# Patient Record
Sex: Male | Born: 1960 | Race: White | Hispanic: No | Marital: Married | Smoking: Never smoker
Health system: Southern US, Community
[De-identification: ages and names within clinical notes are randomized; demographics above are authoritative.]

---

## 2014-07-16 HISTORY — PX: CATARACT EXTRACTION: SUR2

## 2016-07-17 DIAGNOSIS — S83412A Sprain of medial collateral ligament of left knee, initial encounter: Secondary | ICD-10-CM | POA: Diagnosis not present

## 2016-07-17 DIAGNOSIS — M25562 Pain in left knee: Secondary | ICD-10-CM | POA: Diagnosis not present

## 2016-08-14 DIAGNOSIS — Z0000 Encounter for general adult medical examination without abnormal findings: Secondary | ICD-10-CM | POA: Diagnosis not present

## 2016-09-12 DIAGNOSIS — H43811 Vitreous degeneration, right eye: Secondary | ICD-10-CM | POA: Diagnosis not present

## 2016-09-12 DIAGNOSIS — H43391 Other vitreous opacities, right eye: Secondary | ICD-10-CM | POA: Diagnosis not present

## 2017-04-08 DIAGNOSIS — B3749 Other urogenital candidiasis: Secondary | ICD-10-CM | POA: Diagnosis not present

## 2017-04-08 DIAGNOSIS — Z23 Encounter for immunization: Secondary | ICD-10-CM | POA: Diagnosis not present

## 2017-09-17 DIAGNOSIS — Z13220 Encounter for screening for lipoid disorders: Secondary | ICD-10-CM | POA: Diagnosis not present

## 2017-09-17 DIAGNOSIS — Z0000 Encounter for general adult medical examination without abnormal findings: Secondary | ICD-10-CM | POA: Diagnosis not present

## 2017-09-25 DIAGNOSIS — X32XXXD Exposure to sunlight, subsequent encounter: Secondary | ICD-10-CM | POA: Diagnosis not present

## 2017-09-25 DIAGNOSIS — D225 Melanocytic nevi of trunk: Secondary | ICD-10-CM | POA: Diagnosis not present

## 2017-09-25 DIAGNOSIS — L821 Other seborrheic keratosis: Secondary | ICD-10-CM | POA: Diagnosis not present

## 2017-09-25 DIAGNOSIS — L570 Actinic keratosis: Secondary | ICD-10-CM | POA: Diagnosis not present

## 2017-10-26 DIAGNOSIS — M5416 Radiculopathy, lumbar region: Secondary | ICD-10-CM | POA: Diagnosis not present

## 2017-10-30 DIAGNOSIS — M5441 Lumbago with sciatica, right side: Secondary | ICD-10-CM | POA: Diagnosis not present

## 2017-10-30 DIAGNOSIS — M5136 Other intervertebral disc degeneration, lumbar region: Secondary | ICD-10-CM | POA: Diagnosis not present

## 2017-10-30 DIAGNOSIS — M9904 Segmental and somatic dysfunction of sacral region: Secondary | ICD-10-CM | POA: Diagnosis not present

## 2017-10-30 DIAGNOSIS — M9903 Segmental and somatic dysfunction of lumbar region: Secondary | ICD-10-CM | POA: Diagnosis not present

## 2017-11-11 DIAGNOSIS — M5136 Other intervertebral disc degeneration, lumbar region: Secondary | ICD-10-CM | POA: Diagnosis not present

## 2017-11-11 DIAGNOSIS — M9903 Segmental and somatic dysfunction of lumbar region: Secondary | ICD-10-CM | POA: Diagnosis not present

## 2017-11-11 DIAGNOSIS — M9904 Segmental and somatic dysfunction of sacral region: Secondary | ICD-10-CM | POA: Diagnosis not present

## 2017-11-13 DIAGNOSIS — M9903 Segmental and somatic dysfunction of lumbar region: Secondary | ICD-10-CM | POA: Diagnosis not present

## 2017-11-13 DIAGNOSIS — M5136 Other intervertebral disc degeneration, lumbar region: Secondary | ICD-10-CM | POA: Diagnosis not present

## 2017-11-13 DIAGNOSIS — M9904 Segmental and somatic dysfunction of sacral region: Secondary | ICD-10-CM | POA: Diagnosis not present

## 2017-11-18 DIAGNOSIS — M9903 Segmental and somatic dysfunction of lumbar region: Secondary | ICD-10-CM | POA: Diagnosis not present

## 2017-11-18 DIAGNOSIS — M9904 Segmental and somatic dysfunction of sacral region: Secondary | ICD-10-CM | POA: Diagnosis not present

## 2017-11-18 DIAGNOSIS — M5136 Other intervertebral disc degeneration, lumbar region: Secondary | ICD-10-CM | POA: Diagnosis not present

## 2017-11-22 DIAGNOSIS — M5136 Other intervertebral disc degeneration, lumbar region: Secondary | ICD-10-CM | POA: Diagnosis not present

## 2017-11-22 DIAGNOSIS — M9904 Segmental and somatic dysfunction of sacral region: Secondary | ICD-10-CM | POA: Diagnosis not present

## 2017-11-22 DIAGNOSIS — M9903 Segmental and somatic dysfunction of lumbar region: Secondary | ICD-10-CM | POA: Diagnosis not present

## 2017-11-25 DIAGNOSIS — M9903 Segmental and somatic dysfunction of lumbar region: Secondary | ICD-10-CM | POA: Diagnosis not present

## 2017-11-25 DIAGNOSIS — M5136 Other intervertebral disc degeneration, lumbar region: Secondary | ICD-10-CM | POA: Diagnosis not present

## 2017-11-25 DIAGNOSIS — M9904 Segmental and somatic dysfunction of sacral region: Secondary | ICD-10-CM | POA: Diagnosis not present

## 2017-11-29 DIAGNOSIS — M9903 Segmental and somatic dysfunction of lumbar region: Secondary | ICD-10-CM | POA: Diagnosis not present

## 2017-11-29 DIAGNOSIS — M9904 Segmental and somatic dysfunction of sacral region: Secondary | ICD-10-CM | POA: Diagnosis not present

## 2017-11-29 DIAGNOSIS — M5136 Other intervertebral disc degeneration, lumbar region: Secondary | ICD-10-CM | POA: Diagnosis not present

## 2017-12-02 DIAGNOSIS — M9904 Segmental and somatic dysfunction of sacral region: Secondary | ICD-10-CM | POA: Diagnosis not present

## 2017-12-02 DIAGNOSIS — M5136 Other intervertebral disc degeneration, lumbar region: Secondary | ICD-10-CM | POA: Diagnosis not present

## 2017-12-02 DIAGNOSIS — M9903 Segmental and somatic dysfunction of lumbar region: Secondary | ICD-10-CM | POA: Diagnosis not present

## 2017-12-06 DIAGNOSIS — M9903 Segmental and somatic dysfunction of lumbar region: Secondary | ICD-10-CM | POA: Diagnosis not present

## 2017-12-06 DIAGNOSIS — M9904 Segmental and somatic dysfunction of sacral region: Secondary | ICD-10-CM | POA: Diagnosis not present

## 2017-12-06 DIAGNOSIS — M5136 Other intervertebral disc degeneration, lumbar region: Secondary | ICD-10-CM | POA: Diagnosis not present

## 2017-12-07 DIAGNOSIS — L237 Allergic contact dermatitis due to plants, except food: Secondary | ICD-10-CM | POA: Diagnosis not present

## 2017-12-10 DIAGNOSIS — M9904 Segmental and somatic dysfunction of sacral region: Secondary | ICD-10-CM | POA: Diagnosis not present

## 2017-12-10 DIAGNOSIS — M5136 Other intervertebral disc degeneration, lumbar region: Secondary | ICD-10-CM | POA: Diagnosis not present

## 2017-12-10 DIAGNOSIS — M9903 Segmental and somatic dysfunction of lumbar region: Secondary | ICD-10-CM | POA: Diagnosis not present

## 2017-12-12 DIAGNOSIS — M5136 Other intervertebral disc degeneration, lumbar region: Secondary | ICD-10-CM | POA: Diagnosis not present

## 2017-12-12 DIAGNOSIS — M9904 Segmental and somatic dysfunction of sacral region: Secondary | ICD-10-CM | POA: Diagnosis not present

## 2017-12-12 DIAGNOSIS — M9903 Segmental and somatic dysfunction of lumbar region: Secondary | ICD-10-CM | POA: Diagnosis not present

## 2017-12-17 DIAGNOSIS — M9904 Segmental and somatic dysfunction of sacral region: Secondary | ICD-10-CM | POA: Diagnosis not present

## 2017-12-17 DIAGNOSIS — M9903 Segmental and somatic dysfunction of lumbar region: Secondary | ICD-10-CM | POA: Diagnosis not present

## 2017-12-17 DIAGNOSIS — M5136 Other intervertebral disc degeneration, lumbar region: Secondary | ICD-10-CM | POA: Diagnosis not present

## 2017-12-24 DIAGNOSIS — M9904 Segmental and somatic dysfunction of sacral region: Secondary | ICD-10-CM | POA: Diagnosis not present

## 2017-12-24 DIAGNOSIS — M9903 Segmental and somatic dysfunction of lumbar region: Secondary | ICD-10-CM | POA: Diagnosis not present

## 2017-12-24 DIAGNOSIS — M5136 Other intervertebral disc degeneration, lumbar region: Secondary | ICD-10-CM | POA: Diagnosis not present

## 2018-01-15 DIAGNOSIS — M5136 Other intervertebral disc degeneration, lumbar region: Secondary | ICD-10-CM | POA: Diagnosis not present

## 2018-01-15 DIAGNOSIS — M9904 Segmental and somatic dysfunction of sacral region: Secondary | ICD-10-CM | POA: Diagnosis not present

## 2018-01-15 DIAGNOSIS — M9903 Segmental and somatic dysfunction of lumbar region: Secondary | ICD-10-CM | POA: Diagnosis not present

## 2018-01-22 DIAGNOSIS — L259 Unspecified contact dermatitis, unspecified cause: Secondary | ICD-10-CM | POA: Diagnosis not present

## 2018-02-12 DIAGNOSIS — M5136 Other intervertebral disc degeneration, lumbar region: Secondary | ICD-10-CM | POA: Diagnosis not present

## 2018-02-12 DIAGNOSIS — M9903 Segmental and somatic dysfunction of lumbar region: Secondary | ICD-10-CM | POA: Diagnosis not present

## 2018-02-12 DIAGNOSIS — M9904 Segmental and somatic dysfunction of sacral region: Secondary | ICD-10-CM | POA: Diagnosis not present

## 2018-02-19 DIAGNOSIS — M9904 Segmental and somatic dysfunction of sacral region: Secondary | ICD-10-CM | POA: Diagnosis not present

## 2018-02-19 DIAGNOSIS — M9903 Segmental and somatic dysfunction of lumbar region: Secondary | ICD-10-CM | POA: Diagnosis not present

## 2018-02-19 DIAGNOSIS — M5136 Other intervertebral disc degeneration, lumbar region: Secondary | ICD-10-CM | POA: Diagnosis not present

## 2018-03-01 DIAGNOSIS — S86112A Strain of other muscle(s) and tendon(s) of posterior muscle group at lower leg level, left leg, initial encounter: Secondary | ICD-10-CM | POA: Diagnosis not present

## 2018-03-10 DIAGNOSIS — M79662 Pain in left lower leg: Secondary | ICD-10-CM | POA: Diagnosis not present

## 2018-03-12 DIAGNOSIS — M9903 Segmental and somatic dysfunction of lumbar region: Secondary | ICD-10-CM | POA: Diagnosis not present

## 2018-03-12 DIAGNOSIS — M5136 Other intervertebral disc degeneration, lumbar region: Secondary | ICD-10-CM | POA: Diagnosis not present

## 2018-03-12 DIAGNOSIS — M9904 Segmental and somatic dysfunction of sacral region: Secondary | ICD-10-CM | POA: Diagnosis not present

## 2018-04-02 DIAGNOSIS — M5136 Other intervertebral disc degeneration, lumbar region: Secondary | ICD-10-CM | POA: Diagnosis not present

## 2018-04-02 DIAGNOSIS — M9904 Segmental and somatic dysfunction of sacral region: Secondary | ICD-10-CM | POA: Diagnosis not present

## 2018-04-02 DIAGNOSIS — M9903 Segmental and somatic dysfunction of lumbar region: Secondary | ICD-10-CM | POA: Diagnosis not present

## 2018-04-18 DIAGNOSIS — J029 Acute pharyngitis, unspecified: Secondary | ICD-10-CM | POA: Diagnosis not present

## 2019-04-20 ENCOUNTER — Other Ambulatory Visit: Payer: Self-pay

## 2019-04-20 DIAGNOSIS — Z20822 Contact with and (suspected) exposure to covid-19: Secondary | ICD-10-CM

## 2019-04-22 LAB — NOVEL CORONAVIRUS, NAA: SARS-CoV-2, NAA: NOT DETECTED

## 2019-12-21 ENCOUNTER — Ambulatory Visit (INDEPENDENT_AMBULATORY_CARE_PROVIDER_SITE_OTHER): Payer: Managed Care, Other (non HMO) | Admitting: Ophthalmology

## 2019-12-21 ENCOUNTER — Encounter (INDEPENDENT_AMBULATORY_CARE_PROVIDER_SITE_OTHER): Payer: Self-pay | Admitting: Ophthalmology

## 2019-12-21 ENCOUNTER — Other Ambulatory Visit: Payer: Self-pay

## 2019-12-21 DIAGNOSIS — H35371 Puckering of macula, right eye: Secondary | ICD-10-CM | POA: Insufficient documentation

## 2019-12-21 DIAGNOSIS — H353131 Nonexudative age-related macular degeneration, bilateral, early dry stage: Secondary | ICD-10-CM | POA: Diagnosis not present

## 2019-12-21 NOTE — Assessment & Plan Note (Signed)

## 2019-12-21 NOTE — Progress Notes (Signed)
12/21/2019     CHIEF COMPLAINT Patient presents for Post-op Follow-up   HISTORY OF PRESENT ILLNESS: Cody Elliott is a 59 y.o. male who presents to the clinic today for:   HPI    Post-op Follow-up    In left eye.  Discomfort includes floaters.  Vision is stable.          Comments    8 week follow up - PO OS Patient states that the floater in his right eye gives him issues. Other than that, no problems.        Last edited by Gerda Diss on 12/21/2019  8:26 AM. (History)      Referring physician: Lawerance Cruel, MD [place],  [place] [id number]  HISTORICAL INFORMATION:   Selected notes from the [facility]: No current outpatient medications on file. (Ophthalmic Drugs)   No current facility-administered medications for this visit. (Ophthalmic Drugs)   Current Outpatient Medications (Other)  Medication Sig  . allopurinol (ZYLOPRIM) 100 MG tablet 1 troche as directed as directed   No current facility-administered medications for this visit. (Other)      REVIEW OF SYSTEMS:    ALLERGIES No Known Allergies  PAST MEDICAL HISTORY History reviewed. No pertinent past medical history. Past Surgical History:  Procedure Laterality Date  . CATARACT EXTRACTION Right 2016   [facility]  . CATARACT EXTRACTION Left 2016   [facility]    FAMILY HISTORY History reviewed. No pertinent family history.  SOCIAL HISTORY Social History   Tobacco Use  . Smoking status: Never Smoker  . Smokeless tobacco: Never Used  Substance Use Topics  . Alcohol use: Not on file  . Drug use: Not on file         OPHTHALMIC EXAM:  Base Eye Exam    Visual Acuity (Snellen - Linear)      Right Left   Dist cc 20/20 20/100   Dist ph cc  20/30       Tonometry (Tonopen, 8:30 AM)      Right Left   Pressure 12 14       Dilation    Left eye: 1.0% Mydriacyl, 2.5% Phenylephrine @ 8:30 AM        Slit Lamp and Fundus Exam     External Exam      Right Left   External Normal Normal       Slit Lamp Exam      Right Left   Lids/Lashes Normal Normal   Conjunctiva/Sclera White and quiet White and quiet   Cornea Clear Clear   Anterior Chamber Deep and quiet Deep and quiet   Iris Round and reactive Round and reactive   Lens Posterior chamber intraocular lens Posterior chamber intraocular lens   Vitreous Normal Normal          IMAGING AND PROCEDURES  Imaging and Procedures for 12/21/19  Color Fundus Photography Optos - OU - Both Eyes       Right Eye Progression has been stable. Disc findings include normal observations. Macula : drusen, epiretinal membrane. Vessels : normal observations.   Left Eye Progression has been stable. Disc findings include normal observations. Macula : drusen. Vessels : normal observations. Periphery : normal observations.   Notes Retina attached.       OCT, Retina - OU - Both Eyes       Right Eye Quality was good. Scan locations included subfoveal. Findings include epiretinal membrane.  Left Eye Quality was good. Scan locations included subfoveal. Findings include epiretinal membrane.   Notes OD, now with foveal straightening secondary to Epiretinal membrane.  This a clear change from his OCT evaluation some 3 months previous.  Notably there is no overt intraretinal CME that we might see an advanced macular telangiectasis.   OS some foveal thickening of the left eye does suggest this might be present.  There is however no clinical CME by OCT.                  ASSESSMENT/PLAN:  Macular pucker, right eye The nature of macular pucker (epiretinal membrane ERM) was discussed with the patient as well as threshold criteria for vitrectomy surgery. I explained that in rare cases another surgery is needed to actually remove a second wrinkle should it regrow.  Most often, the epiretinal membrane and underlying wrinkled internal limiting membrane are removed with  the first surgery, to accomplish the goals.   If the operative eye is [place] (natural lens still present), cataract surgery is often recommended prior to Vitrectomy. This will enable the retina surgeon to have the best view during surgery and the patient to obtain optimal results in the future. Treatment options were discussed.      ICD-10-CM   1. Macular pucker, right eye  H35.371 Color Fundus Photography Optos - OU - Both Eyes  2. Early stage nonexudative age-related macular degeneration of both eyes  H35.3131 Color Fundus Photography Optos - OU - Both Eyes    OCT, Retina - OU - Both Eyes    1.  Small distortion parafoveal vision in the left eye which has no correlate to the OCT macular findings.  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  This was discussed at length with the patient.  He does have significant snoring but this is treated and prevented by his side sleeping.  He does not awaken at night to use the bathroom his review of systems for sleep apnea are mild to moderate at most.  2.  We will reschedule patient in the near future for this chronic symptom to look at the macular perfusion to look for macular telangiectasis. 3.,  Incidentally fluorescein angiography, [place] left eye right eye transit will also study the microperfusion of the macula right eye which has progressive epiretinal membrane of the last 3 months  Ophthalmic Meds Ordered this visit:  No orders of the defined types were placed in this encounter.      Return in about 2 weeks (around 01/04/2020) for DILATE OU, HBERG FFA L\R.  There are no Patient Instructions on file for this visit.   Explained the diagnoses, plan, and follow up with the patient and they expressed understanding.  Patient expressed understanding of the importance of proper follow up care.   Clent Demark Chemere Steffler M.D. Diseases & Surgery of the Retina and Vitreous [facility] [place] 12/21/19     Abbreviations: M myopia (nearsighted); A astigmatism; H hyperopia (farsighted); P presbyopia; Mrx spectacle prescription;  CTL contact lenses; OD right eye; OS left eye; OU both eyes  XT exotropia; ET esotropia; PEK punctate epithelial keratitis; PEE punctate epithelial erosions; DES dry eye syndrome; MGD meibomian gland dysfunction; ATs artificial tears; PFAT's preservative free artificial tears; [place] nuclear sclerotic cataract; PSC posterior subcapsular cataract; ERM epi-retinal membrane; PVD posterior vitreous detachment; RD retinal detachment; DM diabetes mellitus; DR diabetic retinopathy; NPDR non-proliferative diabetic retinopathy; PDR proliferative diabetic retinopathy; CSME clinically significant  macular edema; DME diabetic macular edema; dbh dot blot hemorrhages; CWS cotton wool spot; POAG primary open angle glaucoma; C/D cup-to-disc ratio; HVF humphrey visual field; GVF goldmann visual field; OCT optical coherence tomography; IOP intraocular pressure; BRVO Branch retinal vein occlusion; CRVO central retinal vein occlusion; CRAO central retinal artery occlusion; BRAO branch retinal artery occlusion; RT retinal tear; SB scleral buckle; PPV pars plana vitrectomy; VH Vitreous hemorrhage; PRP panretinal laser photocoagulation; IVK intravitreal kenalog; VMT vitreomacular traction; MH Macular hole;  NVD neovascularization of the disc; NVE neovascularization elsewhere; AREDS age related eye disease study; ARMD age related macular degeneration; POAG primary open angle glaucoma; EBMD epithelial/anterior basement membrane dystrophy; ACIOL anterior chamber intraocular lens; IOL intraocular lens; PCIOL posterior chamber intraocular lens; Phaco/IOL phacoemulsification with intraocular lens placement; [place] photorefractive keratectomy; LASIK laser assisted in situ keratomileusis; HTN hypertension; DM diabetes mellitus; COPD chronic obstructive pulmonary disease

## 2020-01-04 ENCOUNTER — Encounter (INDEPENDENT_AMBULATORY_CARE_PROVIDER_SITE_OTHER): Payer: Managed Care, Other (non HMO) | Admitting: Ophthalmology

## 2020-01-19 ENCOUNTER — Encounter (INDEPENDENT_AMBULATORY_CARE_PROVIDER_SITE_OTHER): Payer: Self-pay | Admitting: Ophthalmology

## 2020-01-21 ENCOUNTER — Encounter (INDEPENDENT_AMBULATORY_CARE_PROVIDER_SITE_OTHER): Payer: Managed Care, Other (non HMO) | Admitting: Ophthalmology

## 2020-12-30 ENCOUNTER — Other Ambulatory Visit: Payer: Self-pay | Admitting: Home Modifications

## 2020-12-30 ENCOUNTER — Ambulatory Visit
Admission: RE | Admit: 2020-12-30 | Discharge: 2020-12-30 | Disposition: A | Discharge status: Home / Self Care | Payer: Managed Care, Other (non HMO) | Source: Ambulatory Visit | Attending: Home Modifications | Admitting: Home Modifications

## 2020-12-30 DIAGNOSIS — M25551 Pain in right hip: Secondary | ICD-10-CM

## 2020-12-30 DIAGNOSIS — M25511 Pain in right shoulder: Secondary | ICD-10-CM

## 2021-07-03 IMAGING — CR DG SHOULDER 2+V*R*
3 series · 3 of 3 positions shown · non-contrast
Comparison: None.

CLINICAL DATA: Atraumatic right shoulder pain.

EXAM:
RIGHT SHOULDER - 2+ VIEW

[w shoulder ap internal righ]
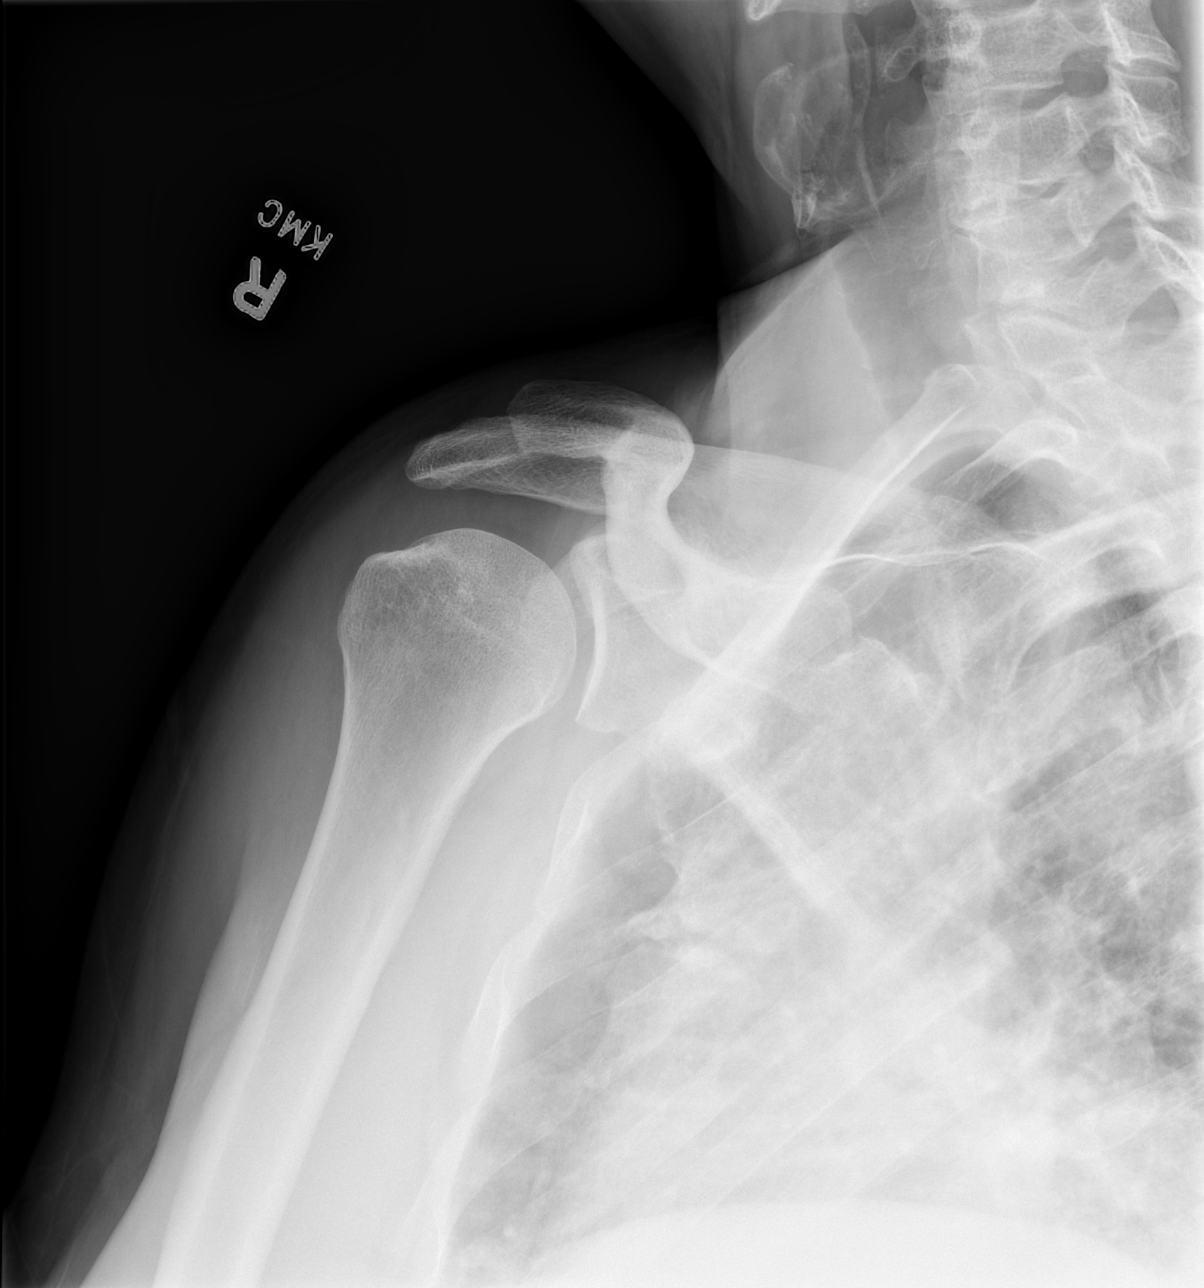

[w shoulder y view right]
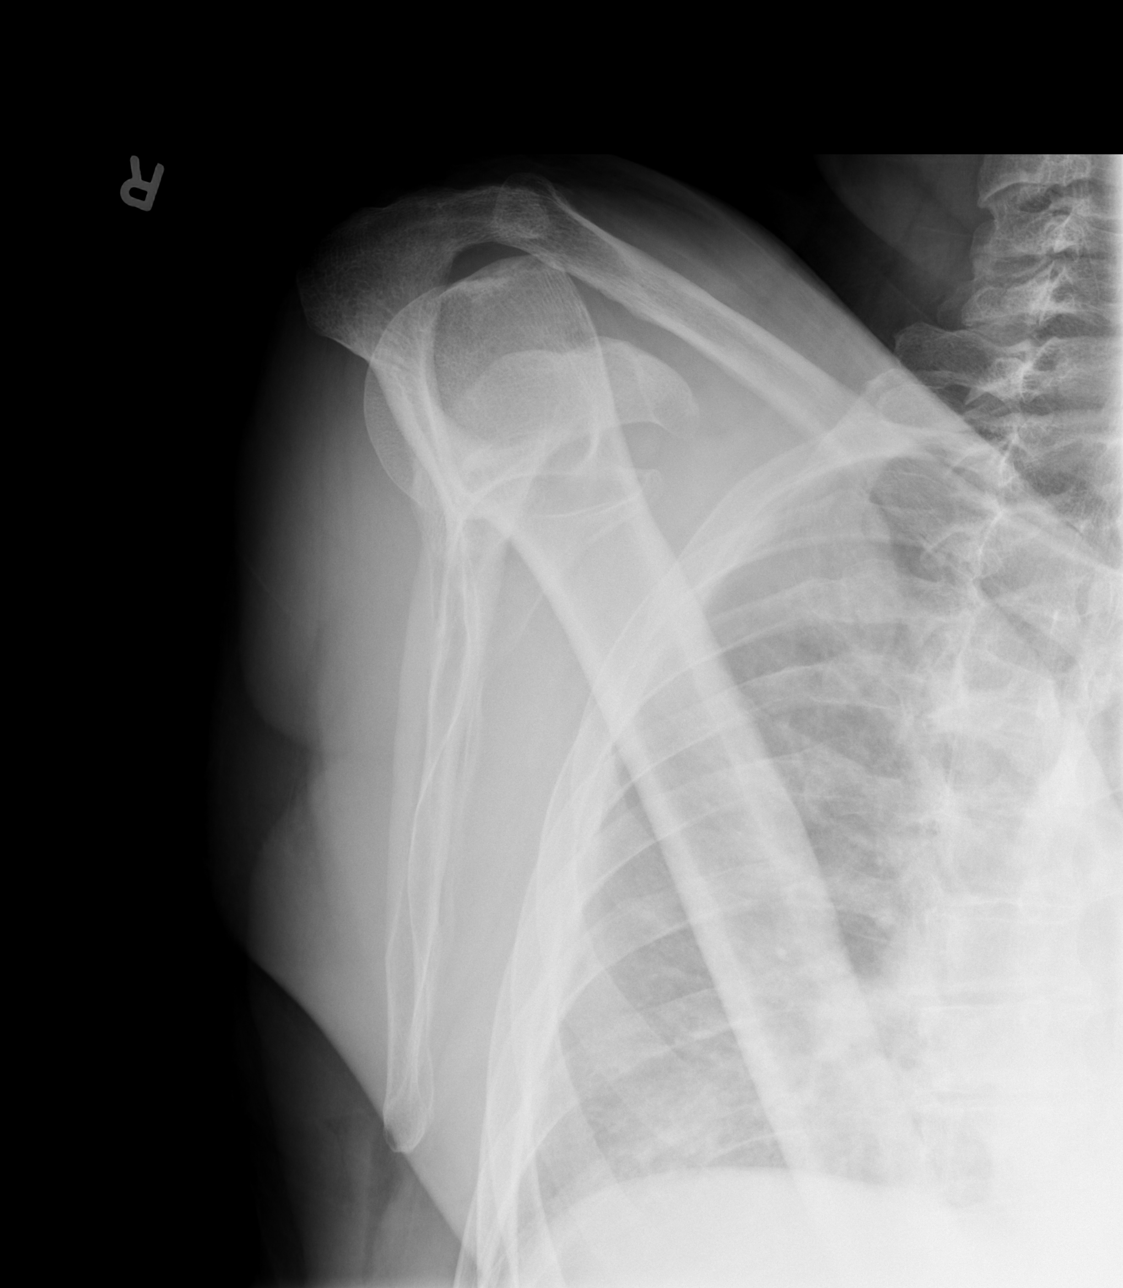

[x shoulder axillary right]
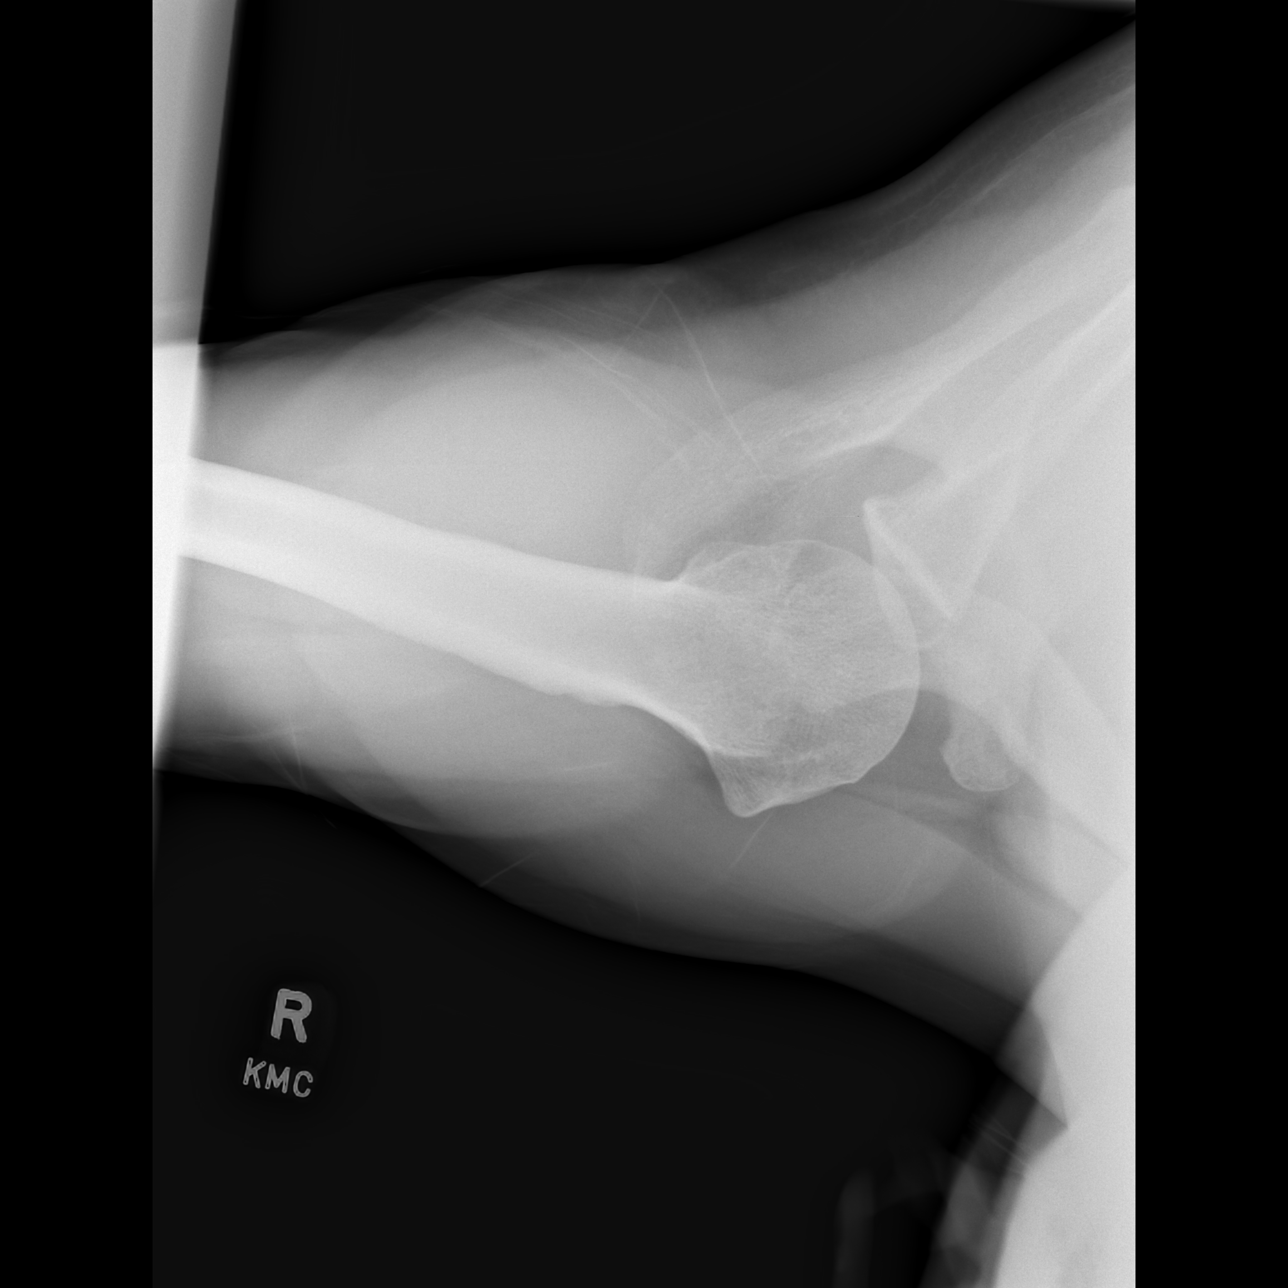

[3 of 3 positions shown; findings below may reference images not displayed]

FINDINGS: There is no evidence of fracture or dislocation. There is no
evidence of arthropathy or other focal bone abnormality. Soft
tissues are unremarkable.
IMPRESSION: Negative.

## 2023-06-04 ENCOUNTER — Other Ambulatory Visit (HOSPITAL_BASED_OUTPATIENT_CLINIC_OR_DEPARTMENT_OTHER): Payer: Self-pay | Admitting: Family Medicine

## 2023-06-04 DIAGNOSIS — Z0000 Encounter for general adult medical examination without abnormal findings: Secondary | ICD-10-CM

## 2023-06-17 ENCOUNTER — Ambulatory Visit (HOSPITAL_COMMUNITY)
Admission: RE | Admit: 2023-06-17 | Discharge: 2023-06-17 | Disposition: A | Discharge status: Home / Self Care | Payer: Managed Care, Other (non HMO) | Source: Ambulatory Visit | Attending: Family Medicine | Admitting: Family Medicine

## 2023-06-17 ENCOUNTER — Encounter (HOSPITAL_COMMUNITY): Payer: Self-pay

## 2023-06-17 DIAGNOSIS — Z0000 Encounter for general adult medical examination without abnormal findings: Secondary | ICD-10-CM | POA: Insufficient documentation
